# Patient Record
Sex: Female | Born: 1965 | Race: White | Hispanic: No | Marital: Married | Smoking: Never smoker
Health system: Southern US, Community
[De-identification: ages and names within clinical notes are randomized; demographics above are authoritative.]

## PROBLEM LIST (undated history)

## (undated) DIAGNOSIS — I10 Essential (primary) hypertension: Secondary | ICD-10-CM

## (undated) HISTORY — PX: CYST EXCISION: SHX5701

## (undated) HISTORY — PX: CHOLECYSTECTOMY: SHX55

---

## 2017-12-24 ENCOUNTER — Other Ambulatory Visit: Payer: Self-pay | Admitting: Obstetrics & Gynecology

## 2017-12-24 DIAGNOSIS — Z1231 Encounter for screening mammogram for malignant neoplasm of breast: Secondary | ICD-10-CM

## 2018-01-20 ENCOUNTER — Ambulatory Visit
Admission: RE | Admit: 2018-01-20 | Discharge: 2018-01-20 | Disposition: A | Discharge status: Home / Self Care | Payer: BLUE CROSS/BLUE SHIELD | Source: Ambulatory Visit | Attending: Obstetrics & Gynecology | Admitting: Obstetrics & Gynecology

## 2018-01-20 DIAGNOSIS — Z1231 Encounter for screening mammogram for malignant neoplasm of breast: Secondary | ICD-10-CM

## 2018-04-28 NOTE — H&P (Signed)
52yo G2P2 who presents for [facility], D&C, ablation due to AUB. She reports no acute changes or problems since her last visit. Denies intermenstrual bleeding. Denies vaginal discharge, itching or odor. In review she notes that her periods have become heavier over the past year. Menses last for 7 days, 2 days of heavy flow where she often has to use a tampon and a pad- that will usually last for 2-4hours- just depends. She has been on OCPs for years- has not had a change in the pill and reports good compliance. At this point she wishes to proceed with ablation to help improve her periods Work up completed: 8/30: TVUS: [place] completed today: 12cm uterus with 3 fibroids- 4.5 fundal, post 4.7cm and ant 2.2cm. Normal left ovary. Right ovary wnl EMB: fragments of polyp, bening endometrium, no hyperplasia or malignancy.   Current Medications  Taking   Hydrochlorothiazide-12.5 mg 12.5 mg Tablet one tablet Orally once a day   Yasmin 28(Drospirenone-Ethinyl Estradiol) 3-0.03 MG Tablet 1 tablet Orally Once a day   Medication List reviewed and reconciled with the patient    Past Medical History  Hypertension.           Surgical History  C section   gall bladder   Nodule removal    Family History  Father: deceased, diagnosed with Hypertension, CVA  Mother: deceased, Hypertension, CVA  Maternal aunt: alive, Breast cancer   Social History  General:  Alcohol: yes.  Children: 2.  Tobacco use  cigarettes: Never smoked Tobacco history last updated 01/28/2018 Marital Status: married.  no Recreational drug use.  OCCUPATION: home maker.    Gyn History  Sexual activity currently sexually active.  Periods : every month.  LMP 04-07-18 .  Last pap smear date 05/2018- outside facility- in records.  Last mammogram date 01/20/2018.  Denies H/O STD.    OB History  Number of pregnancies 2.  Pregnancy # 1 live birth, vaginal delivery.  Pregnancy # 2 C-section.    Allergies  Penicillin: rash    Hospitalization/Major Diagnostic Procedure  None this past yr 01/2018   Review of Systems  CONSTITUTIONAL:  no Chills. no Fever. no Night sweats.  HEENT:  Blurrred vision no. no Double vision.  CARDIOLOGY:  no Chest pain.  RESPIRATORY:  no Shortness of breath. no Cough.  UROLOGY:  no Urinary frequency. no Urinary incontinence. no Urinary urgency.  GASTROENTEROLOGY:  no Abdominal pain. no Appetite change. no Change in bowel movements.  FEMALE REPRODUCTIVE:  See HPI for details. no Breast lumps or discharge. no Breast pain.  NEUROLOGY:  no Dizziness. no Headache. no Loss of consciousness.  PSYCHOLOGY:  no Anxiety. no Depression.  SKIN:  no Rash. no Hives.  HEMATOLOGY/LYMPH:  no Anemia. no Fatigue. Using Blood Thinners no.     Vital Signs  Wt 194.5, Wt change 1 lb, Ht 62.5, BMI 35.00, BP sitting 122/88.   Examination  General Examination: CONSTITUTIONAL: well developed, well nourished.  SKIN: warm and dry, no rashes.  NECK: supple, normal appearance.  LUNGS: clear to auscultation bilaterally, no wheezes, rhonchi, rales.  HEART: no murmurs, regular rate and rhythm.  ABDOMEN: soft and not tender, no masses palpated, no rebound, no rigidity.  MUSCULOSKELETAL no calf tenderness bilaterally.  EXTREMITIES: no edema present.  NEUROLOGIC EXAM: alert and oriented x 3.  PSYCH: appropriate mood and affect.     A/P: 52yo G2P2 who presents for [facility], D&C, HTA ablation -NPO -LR @ 125cc/hr -SCDs to OR -Reviewed upcoming surgery- discussed risk/benefit and  alternatives to treatment. Reviewed risk including but not limited to risk of bleeding, infection, failure of device, uterine perforation. Questions and concerns were addressed and she desires to proceed  Myna Hidalgo, DO [phone] (cell) [phone] (office)

## 2018-05-09 ENCOUNTER — Encounter (HOSPITAL_COMMUNITY): Payer: Self-pay

## 2018-05-09 ENCOUNTER — Encounter (HOSPITAL_COMMUNITY)
Admission: RE | Admit: 2018-05-09 | Discharge: 2018-05-09 | Disposition: A | Discharge status: Home / Self Care | Payer: BLUE CROSS/BLUE SHIELD | Source: Ambulatory Visit | Attending: Obstetrics & Gynecology | Admitting: Obstetrics & Gynecology

## 2018-05-09 ENCOUNTER — Other Ambulatory Visit: Payer: Self-pay

## 2018-05-09 DIAGNOSIS — Z01812 Encounter for preprocedural laboratory examination: Secondary | ICD-10-CM | POA: Insufficient documentation

## 2018-05-09 HISTORY — DX: Essential (primary) hypertension: I10

## 2018-05-09 LAB — COMPREHENSIVE METABOLIC PANEL
ALK PHOS: 73 U/L (ref 38–126)
ALT: 14 U/L (ref 0–44)
AST: 18 U/L (ref 15–41)
Albumin: 3.3 g/dL — ABNORMAL LOW (ref 3.5–5.0)
Anion gap: 9 (ref 5–15)
BILIRUBIN TOTAL: 0.4 mg/dL (ref 0.3–1.2)
BUN: 13 mg/dL (ref 6–20)
CALCIUM: 8.6 mg/dL — AB (ref 8.9–10.3)
CHLORIDE: 102 mmol/L (ref 98–111)
CO2: 26 mmol/L (ref 22–32)
CREATININE: 0.91 mg/dL (ref 0.44–1.00)
Glucose, Bld: 88 mg/dL (ref 70–99)
Potassium: 3.6 mmol/L (ref 3.5–5.1)
Sodium: 137 mmol/L (ref 135–145)
TOTAL PROTEIN: 7.7 g/dL (ref 6.5–8.1)

## 2018-05-09 LAB — CBC
HEMATOCRIT: 28.3 % — AB (ref 36.0–46.0)
Hemoglobin: 8 g/dL — ABNORMAL LOW (ref 12.0–15.0)
MCH: 17.6 pg — AB (ref 26.0–34.0)
MCHC: 28.3 g/dL — ABNORMAL LOW (ref 30.0–36.0)
MCV: 62.3 fL — AB (ref 80.0–100.0)
PLATELETS: 547 10*3/uL — AB (ref 150–400)
RBC: 4.54 MIL/uL (ref 3.87–5.11)
RDW: 17.2 % — ABNORMAL HIGH (ref 11.5–15.5)
WBC: 10.1 10*3/uL (ref 4.0–10.5)
nRBC: 0 % (ref 0.0–0.2)

## 2018-05-09 NOTE — Patient Instructions (Addendum)
Your procedure is scheduled on:Wednesday 11/20 300 pm  Enter through the Main Entrance of [facility] at 130 pm  Pick up the phone at the desk and dial [phone].  Call this number if you have problems the morning of surgery: [phone].  Remember: Do NOT eat food: Do NOT drink clear liquids after: Take these medicines the morning of surgery with a SIP OF WATER:none  Do NOT wear jewelry (body piercing), metal hair clips/bobby pins, make-up, or nail polish. Do NOT wear lotions, powders, or perfumes.  You may wear deoderant. Do NOT shave for 48 hours prior to surgery. Do NOT bring valuables to the hospital. Contacts, dentures, or bridgework may not be worn into surgery.  Have a responsible adult drive you home and stay with you for 24 hours after your procedure

## 2018-05-10 NOTE — Pre-Procedure Instructions (Signed)
Dr. Richardson Landryhouser  Viewed EKG Elveria RisingAWARW OF HISTORY

## 2018-05-17 NOTE — Anesthesia Preprocedure Evaluation (Addendum)
Anesthesia Evaluation  Patient identified by MRN, date of birth, ID band Patient awake    Reviewed: Allergy & Precautions, NPO status , Patient's Chart, lab work & pertinent test results  History of Anesthesia Complications Negative for: history of anesthetic complications  Airway Mallampati: II  TM Distance: >3 FB Neck ROM: Full    Dental no notable dental hx. (+) Teeth Intact, Dental Advisory Given   Pulmonary neg pulmonary ROS,    Pulmonary exam normal breath sounds clear to auscultation       Cardiovascular hypertension, Pt. on medications Normal cardiovascular exam Rhythm:Regular Rate:Normal     Neuro/Psych negative neurological ROS     GI/Hepatic negative GI ROS, Neg liver ROS,   Endo/Other  negative endocrine ROS  Renal/GU negative Renal ROS     Musculoskeletal negative musculoskeletal ROS (+)   Abdominal   Peds  Hematology negative hematology ROS (+) anemia , Hgb 8.0 on 05/09/18   Anesthesia Other Findings Day of surgery medications reviewed with the patient.  Reproductive/Obstetrics                            Anesthesia Physical Anesthesia Plan  ASA: II  Anesthesia Plan: General   Post-op Pain Management:    Induction: Intravenous  PONV Risk Score and Plan: 3 and Treatment may vary due to age or medical condition, Ondansetron, Dexamethasone and Midazolam  Airway Management Planned: LMA  Additional Equipment:   Intra-op Plan:   Post-operative Plan: Extubation in OR  Informed Consent: I have reviewed the patients History and Physical, chart, labs and discussed the procedure including the risks, benefits and alternatives for the proposed anesthesia with the patient or authorized representative who has indicated his/her understanding and acceptance.   Dental advisory given  Plan Discussed with: CRNA  Anesthesia Plan Comments:        Anesthesia Quick  Evaluation

## 2018-05-18 ENCOUNTER — Ambulatory Visit (HOSPITAL_COMMUNITY)
Admission: RE | Admit: 2018-05-18 | Discharge: 2018-05-18 | Disposition: A | Discharge status: Home / Self Care | Payer: BLUE CROSS/BLUE SHIELD | Source: Ambulatory Visit | Attending: Obstetrics & Gynecology | Admitting: Obstetrics & Gynecology

## 2018-05-18 ENCOUNTER — Ambulatory Visit (HOSPITAL_COMMUNITY): Payer: BLUE CROSS/BLUE SHIELD | Admitting: Anesthesiology

## 2018-05-18 ENCOUNTER — Encounter (HOSPITAL_COMMUNITY): Payer: Self-pay | Admitting: Anesthesiology

## 2018-05-18 ENCOUNTER — Other Ambulatory Visit: Payer: Self-pay

## 2018-05-18 ENCOUNTER — Encounter (HOSPITAL_COMMUNITY)
Admission: RE | Disposition: A | Discharge status: Home / Self Care | Payer: Self-pay | Source: Ambulatory Visit | Attending: Obstetrics & Gynecology

## 2018-05-18 DIAGNOSIS — I10 Essential (primary) hypertension: Secondary | ICD-10-CM | POA: Insufficient documentation

## 2018-05-18 DIAGNOSIS — N939 Abnormal uterine and vaginal bleeding, unspecified: Secondary | ICD-10-CM | POA: Insufficient documentation

## 2018-05-18 DIAGNOSIS — N840 Polyp of corpus uteri: Secondary | ICD-10-CM | POA: Insufficient documentation

## 2018-05-18 DIAGNOSIS — Z79899 Other long term (current) drug therapy: Secondary | ICD-10-CM | POA: Diagnosis not present

## 2018-05-18 HISTORY — PX: DILITATION & CURRETTAGE/HYSTROSCOPY WITH HYDROTHERMAL ABLATION: SHX5570

## 2018-05-18 LAB — TYPE AND SCREEN
ABO/RH(D): O POS
ANTIBODY SCREEN: NEGATIVE

## 2018-05-18 LAB — ABO/RH: ABO/RH(D): O POS

## 2018-05-18 SURGERY — DILATATION & CURETTAGE/HYSTEROSCOPY WITH HYDROTHERMAL ABLATION
Anesthesia: General | Site: Vagina

## 2018-05-18 MED ORDER — SODIUM CHLORIDE 0.9 % IR SOLN
Status: DC | PRN
  Administered 2018-05-18: 3000 mL

## 2018-05-18 MED ORDER — LACTATED RINGERS IV SOLN
INTRAVENOUS | Status: DC
  Administered 2018-05-18 (×2): via INTRAVENOUS

## 2018-05-18 MED ORDER — LIDOCAINE-EPINEPHRINE (PF) 1 %-1:200000 IJ SOLN
INTRAMUSCULAR | Status: AC
  Filled 2018-05-18: qty 30

## 2018-05-18 MED ORDER — GLYCOPYRROLATE 0.2 MG/ML IJ SOLN
INTRAMUSCULAR | Status: AC
  Filled 2018-05-18: qty 1

## 2018-05-18 MED ORDER — FENTANYL CITRATE (PF) 100 MCG/2ML IJ SOLN: 25.0000 ug | INTRAMUSCULAR | Status: DC | PRN

## 2018-05-18 MED ORDER — ONDANSETRON HCL 4 MG/2ML IJ SOLN
INTRAMUSCULAR | Status: AC
  Filled 2018-05-18: qty 2

## 2018-05-18 MED ORDER — MIDAZOLAM HCL 2 MG/2ML IJ SOLN
INTRAMUSCULAR | Status: DC | PRN
  Administered 2018-05-18: 2 mg via INTRAVENOUS

## 2018-05-18 MED ORDER — PROPOFOL 10 MG/ML IV BOLUS
INTRAVENOUS | Status: DC | PRN
  Administered 2018-05-18: 60 mg via INTRAVENOUS
  Administered 2018-05-18: 170 mg via INTRAVENOUS
  Administered 2018-05-18 (×2): 30 mg via INTRAVENOUS
  Administered 2018-05-18: 40 mg via INTRAVENOUS

## 2018-05-18 MED ORDER — DEXAMETHASONE SODIUM PHOSPHATE 4 MG/ML IJ SOLN
INTRAMUSCULAR | Status: DC | PRN
  Administered 2018-05-18: 4 mg via INTRAVENOUS

## 2018-05-18 MED ORDER — PROMETHAZINE HCL 25 MG/ML IJ SOLN: 6.2500 mg | INTRAMUSCULAR | Status: DC | PRN

## 2018-05-18 MED ORDER — MIDAZOLAM HCL 2 MG/2ML IJ SOLN
INTRAMUSCULAR | Status: AC
  Filled 2018-05-18: qty 2

## 2018-05-18 MED ORDER — SCOPOLAMINE 1 MG/3DAYS TD PT72
MEDICATED_PATCH | TRANSDERMAL | Status: AC
  Administered 2018-05-18: 1.5 mg via TRANSDERMAL
  Filled 2018-05-18: qty 1

## 2018-05-18 MED ORDER — LIDOCAINE HCL (CARDIAC) PF 100 MG/5ML IV SOSY
PREFILLED_SYRINGE | INTRAVENOUS | Status: AC
  Filled 2018-05-18: qty 5

## 2018-05-18 MED ORDER — LIDOCAINE-EPINEPHRINE 1 %-1:100000 IJ SOLN
INTRAMUSCULAR | Status: AC
  Filled 2018-05-18: qty 1

## 2018-05-18 MED ORDER — LIDOCAINE-EPINEPHRINE (PF) 1 %-1:200000 IJ SOLN
INTRAMUSCULAR | Status: DC | PRN
  Administered 2018-05-18: 20 mL

## 2018-05-18 MED ORDER — LIDOCAINE HCL (CARDIAC) PF 100 MG/5ML IV SOSY
PREFILLED_SYRINGE | INTRAVENOUS | Status: DC | PRN
  Administered 2018-05-18 (×2): 40 mg via INTRAVENOUS

## 2018-05-18 MED ORDER — FENTANYL CITRATE (PF) 250 MCG/5ML IJ SOLN
INTRAMUSCULAR | Status: DC | PRN
  Administered 2018-05-18 (×3): 50 ug via INTRAVENOUS

## 2018-05-18 MED ORDER — OXYCODONE HCL 5 MG PO TABS: 5.0000 mg | ORAL_TABLET | Freq: Once | ORAL | Status: DC | PRN

## 2018-05-18 MED ORDER — LACTATED RINGERS IV SOLN: INTRAVENOUS | Status: DC

## 2018-05-18 MED ORDER — ACETAMINOPHEN 10 MG/ML IV SOLN: 1000.0000 mg | Freq: Once | INTRAVENOUS | Status: DC | PRN

## 2018-05-18 MED ORDER — SCOPOLAMINE 1 MG/3DAYS TD PT72
MEDICATED_PATCH | TRANSDERMAL | Status: AC
  Filled 2018-05-18: qty 1

## 2018-05-18 MED ORDER — SUCCINYLCHOLINE CHLORIDE 200 MG/10ML IV SOSY
PREFILLED_SYRINGE | INTRAVENOUS | Status: AC
  Filled 2018-05-18: qty 10

## 2018-05-18 MED ORDER — FENTANYL CITRATE (PF) 100 MCG/2ML IJ SOLN
INTRAMUSCULAR | Status: AC
  Filled 2018-05-18: qty 4

## 2018-05-18 MED ORDER — ONDANSETRON HCL 4 MG/2ML IJ SOLN
INTRAMUSCULAR | Status: DC | PRN
  Administered 2018-05-18: 4 mg via INTRAVENOUS

## 2018-05-18 MED ORDER — KETOROLAC TROMETHAMINE 30 MG/ML IJ SOLN
INTRAMUSCULAR | Status: DC | PRN
  Administered 2018-05-18: 30 mg via INTRAVENOUS

## 2018-05-18 MED ORDER — OXYCODONE HCL 5 MG/5ML PO SOLN: 5.0000 mg | Freq: Once | ORAL | Status: DC | PRN

## 2018-05-18 MED ORDER — SCOPOLAMINE 1 MG/3DAYS TD PT72
1.0000 | MEDICATED_PATCH | Freq: Once | TRANSDERMAL | Status: DC
  Administered 2018-05-18: 1.5 mg via TRANSDERMAL

## 2018-05-18 MED ORDER — PROPOFOL 10 MG/ML IV BOLUS
INTRAVENOUS | Status: AC
  Filled 2018-05-18: qty 40

## 2018-05-18 SURGICAL SUPPLY — 11 items
CANISTER SUCT 3000ML PPV (MISCELLANEOUS) ×3 IMPLANT
CATH ROBINSON RED A/P 16FR (CATHETERS) ×3 IMPLANT
DILATOR CANAL MILEX (MISCELLANEOUS) ×3 IMPLANT
GLOVE BIOGEL PI IND STRL 7.0 (GLOVE) ×2 IMPLANT
GLOVE BIOGEL PI INDICATOR 7.0 (GLOVE) ×4
GLOVE ECLIPSE 6.5 STRL STRAW (GLOVE) ×3 IMPLANT
GOWN STRL REUS W/TWL LRG LVL3 (GOWN DISPOSABLE) ×6 IMPLANT
PACK VAGINAL MINOR WOMEN LF (CUSTOM PROCEDURE TRAY) ×3 IMPLANT
PAD OB MATERNITY 4.3X12.25 (PERSONAL CARE ITEMS) ×3 IMPLANT
SET GENESYS HTA PROCERVA (MISCELLANEOUS) ×3 IMPLANT
TOWEL OR 17X24 6PK STRL BLUE (TOWEL DISPOSABLE) ×6 IMPLANT

## 2018-05-18 NOTE — Interval H&P Note (Signed)
History and Physical Interval Note:  05/18/2018 3:34 PM  Amber Rios  has presented today for surgery, with the diagnosis of N93.9 Abnormal uterine bleeding N92.0 Menorrhagia w/ regular cycle  The various methods of treatment have been discussed with the patient and family. After consideration of risks, benefits and other options for treatment, the patient has consented to  Procedure(s): DILATATION & CURETTAGE/HYSTEROSCOPY WITH HYDROTHERMAL ABLATION (N/A) as a surgical intervention .  The patient's history has been reviewed, patient examined, no change in status, stable for surgery.  I have reviewed the patient's chart and labs.  Questions were answered to the patient's satisfaction.     Sharon SellerJennifer M Cybele Maule

## 2018-05-18 NOTE — Op Note (Signed)
Operative Report  PreOp: Abnormal uterine bleeding PostOp: same Procedure:  Hysteroscopy, Dilation and Curettage, failed ablation Surgeon: Dr. Myna HidalgoJennifer Kailena Lubas Anesthesia: General Complications: unable to complete ablation EBL: Minimal UOP: 30cc Deficit: 250cc  Findings:12cm anteverted uterus, proliferative endometrium with dense tissue  Specimens: endometrial curettings  Procedure: The patient was taken to the operating room where she underwent general anesthesia without difficulty. The patient was placed in a low lithotomy position using Allen stirrups. The patient was examined with the findings as noted above.  She was then prepped and draped in the normal sterile fashion. The bladder was drained using a red rubber urethral catheter. A sterile speculum was inserted into the vagina. A single tooth tenaculum was placed on the anterior lip of the cervix. The uterus was then sounded to 12. The endocervical canal was then serially dilated using Hank dilators.  Sharp curettage was performed. The tissue was sent to pathology.  The HTA system was inserted- the back wall of the uterus was seen- proliferative tissue noted.  Device was activated; however, failed due to inadequate seal.  Several measures were used to try and re-assess; however device still failed.  Hysteroscope was inserted- no uterine perforation was appreciated. All instrument were removed. Hemostasis was observed at the cervical site using silver nitrate. The patient was repositioned to the supine position. The patient tolerated the procedure without any complications and taken to recovery in stable condition.   Myna HidalgoJennifer Holle Sprick, DO [phone] (pager) [phone] (office)

## 2018-05-18 NOTE — Anesthesia Procedure Notes (Signed)
Procedure Name: LMA Insertion Date/Time: 05/18/2018 4:14 PM Performed by: Elgie CongoMalinova, Davinia Riccardi H, CRNA Pre-anesthesia Checklist: Patient identified, Emergency Drugs available, Suction available and Patient being monitored Patient Re-evaluated:Patient Re-evaluated prior to induction Oxygen Delivery Method: Circle system utilized Preoxygenation: Pre-oxygenation with 100% oxygen Induction Type: IV induction Ventilation: Mask ventilation without difficulty and Oral airway inserted - appropriate to patient size LMA: LMA inserted LMA Size: 4.0 Tube type: Oral Number of attempts: 2 Placement Confirmation: positive ETCO2 and breath sounds checked- equal and bilateral Tube secured with: Tape Dental Injury: Teeth and Oropharynx as per pre-operative assessment

## 2018-05-18 NOTE — Discharge Instructions (Addendum)
°  Post Anesthesia Home Care Instructions  Activity: Get plenty of rest for the remainder of the day. A responsible individual must stay with you for 24 hours following the procedure.  For the next 24 hours, DO NOT: -Drive a car -Advertising copywriterperate machinery -Drink alcoholic beverages -Take any medication unless instructed by your physician -Make any legal decisions or sign important papers.  Meals: Start with liquid foods such as gelatin or soup. Progress to regular foods as tolerated. Avoid greasy, spicy, heavy foods. If nausea and/or vomiting occur, drink only clear liquids until the nausea and/or vomiting subsides. Call your physician if vomiting continues.  Special Instructions/Symptoms: Your throat may feel dry or sore from the anesthesia or the breathing tube placed in your throat during surgery. If this causes discomfort, gargle with warm salt water. The discomfort should disappear within 24 hours.  If you had a scopolamine patch placed behind your ear for the management of post- operative nausea and/or vomiting:  1. The medication in the patch is effective for 72 hours, after which it should be removed.  Wrap patch in a tissue and discard in the trash. Wash hands thoroughly with soap and water. 2. You may remove the patch earlier than 72 hours if you experience unpleasant side effects which may include dry mouth, dizziness or visual disturbances. 3. Avoid touching the patch. Wash your hands with soap and water after contact with the patch.   HOME INSTRUCTIONS  Please note any unusual or excessive bleeding, pain, swelling. Mild dizziness or drowsiness are normal for about 24 hours after surgery.   Shower when comfortable  Restrictions: No driving for 24 hours or while taking pain medications.  Activity:  No heavy lifting (> 10 lbs), nothing in vagina (no tampons, douching, or intercourse) x 2 weeks; no tub baths for 2 weeks Vaginal spotting is expected but if your bleeding is heavy,  period like,  please call the office   Incision: the bandaids will fall off when they are ready to; you may clean your incision with mild soap and water but do not rub or scrub the incision site.  You may experience slight bloody drainage from your incision periodically.  This is normal.  If you experience a large amount of drainage or the incision opens, please call your physician who will likely direct you to the emergency department.  Diet:  You may return to your regular diet.  Do not eat large meals.  Eat small frequent meals throughout the day.  Continue to drink a good amount of water at least 6-8 glasses of water per day, hydration is very important for the healing process.  Pain Management: Take over the counter Ibuprofen (Motrin) and/or tylenol as prescribed/needed for pain.  Always take prescription pain medication with food, it may cause constipation, increase fluids and fiber and you may want to take an over-the-counter stool softener like Colace as needed up to 2x a day.    Alcohol -- Avoid for 24 hours and while taking pain medications.  Nausea: Take sips of ginger ale or soda  Fever -- Call physician if temperature over 101 degrees  Follow up:  If you do not already have a follow up appointment scheduled, please call the office at [phone].  If you experience fever (a temperature greater than 100.4), pain unrelieved by pain medication, shortness of breath, swelling of a single leg, or any other symptoms which are concerning to you please the office immediately.

## 2018-05-18 NOTE — Transfer of Care (Signed)
Immediate Anesthesia Transfer of Care Note  Patient: Amber Rios  Procedure(s) Performed: DILATATION & CURETTAGE /DIAGNOSTIC HYSTEROSCOPY, FAILED ABLATION (N/A Vagina )  Patient Location: PACU  Anesthesia Type:General  Level of Consciousness: awake, alert  and oriented  Airway & Oxygen Therapy: Patient Spontanous Breathing and Patient connected to nasal cannula oxygen  Post-op Assessment: Report given to RN, Post -op Vital signs reviewed and stable and Patient moving all extremities  Post vital signs: Reviewed and stable  Last Vitals:  Vitals Value Taken Time  BP 134/74 05/18/2018  5:07 PM  Temp    Pulse 92 05/18/2018  5:09 PM  Resp 11 05/18/2018  5:09 PM  SpO2 100 % 05/18/2018  5:09 PM  Vitals shown include unvalidated device data.  Last Pain:  Vitals:   05/18/18 1346  TempSrc: Oral  PainSc: 0-No pain      Patients Stated Pain Goal: 3 (05/18/18 1346)  Complications: No apparent anesthesia complications

## 2018-05-18 NOTE — Anesthesia Postprocedure Evaluation (Signed)
Anesthesia Post Note  Patient: Leeanne Rioeresa N Brafford  Procedure(s) Performed: DILATATION & CURETTAGE /DIAGNOSTIC HYSTEROSCOPY, FAILED ABLATION (N/A Vagina )     Patient location during evaluation: PACU Anesthesia Type: General Level of consciousness: awake and alert Pain management: pain level controlled Vital Signs Assessment: post-procedure vital signs reviewed and stable Respiratory status: spontaneous breathing, nonlabored ventilation and respiratory function stable Cardiovascular status: blood pressure returned to baseline and stable Postop Assessment: no apparent nausea or vomiting Anesthetic complications: no    Last Vitals:  Vitals:   05/18/18 1346  BP: (!) 152/81  Pulse: (!) 101  Resp: 16  Temp: 37.2 C  SpO2: 100%    Last Pain:  Vitals:   05/18/18 1346  TempSrc: Oral  PainSc: 0-No pain   Pain Goal: Patients Stated Pain Goal: 3 (05/18/18 1346)               Kaylyn LayerKathryn E Jakeel Starliper

## 2018-05-19 ENCOUNTER — Encounter (HOSPITAL_COMMUNITY): Payer: Self-pay | Admitting: Obstetrics & Gynecology

## 2019-05-19 DIAGNOSIS — Z01419 Encounter for gynecological examination (general) (routine) without abnormal findings: Secondary | ICD-10-CM | POA: Diagnosis not present

## 2019-05-23 DIAGNOSIS — Z6834 Body mass index (BMI) 34.0-34.9, adult: Secondary | ICD-10-CM | POA: Diagnosis not present

## 2019-05-23 DIAGNOSIS — Z20828 Contact with and (suspected) exposure to other viral communicable diseases: Secondary | ICD-10-CM | POA: Diagnosis not present

## 2019-06-25 DIAGNOSIS — Z23 Encounter for immunization: Secondary | ICD-10-CM | POA: Diagnosis not present

## 2019-07-24 DIAGNOSIS — I10 Essential (primary) hypertension: Secondary | ICD-10-CM | POA: Diagnosis not present

## 2019-07-24 DIAGNOSIS — N921 Excessive and frequent menstruation with irregular cycle: Secondary | ICD-10-CM | POA: Diagnosis not present

## 2019-07-24 DIAGNOSIS — Z13220 Encounter for screening for lipoid disorders: Secondary | ICD-10-CM | POA: Diagnosis not present

## 2019-07-24 DIAGNOSIS — Z0000 Encounter for general adult medical examination without abnormal findings: Secondary | ICD-10-CM | POA: Diagnosis not present

## 2019-07-24 DIAGNOSIS — N951 Menopausal and female climacteric states: Secondary | ICD-10-CM | POA: Diagnosis not present

## 2019-08-26 ENCOUNTER — Ambulatory Visit: Payer: BC Managed Care – PPO | Attending: Internal Medicine

## 2019-08-26 DIAGNOSIS — Z23 Encounter for immunization: Secondary | ICD-10-CM | POA: Insufficient documentation

## 2019-08-26 NOTE — Progress Notes (Signed)
   Covid-19 Vaccination Clinic  Name:  CARLETHA DAWN    MRN: [id number] DOB: August 14, 1965  08/26/2019  Ms. Burr was observed post Covid-19 immunization for 30 minutes based on pre-vaccination screening without incidence. She was provided with Vaccine Information Sheet and instruction to access the V-Safe system.   Ms. Stemmler was instructed to call 911 with any severe reactions post vaccine: Marland Kitchen Difficulty breathing  . Swelling of your face and throat  . A fast heartbeat  . A bad rash all over your body  . Dizziness and weakness    Immunizations Administered    Name Date Dose VIS Date Route   Pfizer COVID-19 Vaccine 08/26/2019  2:07 PM 0.3 mL 06/09/2019 Intramuscular   Manufacturer: [employer], [employer]   Lot: [id number]   NDC: [address]-1

## 2019-09-16 ENCOUNTER — Ambulatory Visit: Payer: BC Managed Care – PPO | Attending: Internal Medicine

## 2019-09-16 DIAGNOSIS — Z23 Encounter for immunization: Secondary | ICD-10-CM

## 2019-09-16 NOTE — Progress Notes (Signed)
   Covid-19 Vaccination Clinic  Name:  Amber Rios    MRN: [id number] DOB: October 18, 1965  09/16/2019  Amber Rios was observed post Covid-19 immunization for 15 minutes without incident. She was provided with Vaccine Information Sheet and instruction to access the V-Safe system.   Amber Rios was instructed to call 911 with any severe reactions post vaccine: Marland Kitchen Difficulty breathing  . Swelling of face and throat  . A fast heartbeat  . A bad rash all over body  . Dizziness and weakness   Immunizations Administered    Name Date Dose VIS Date Route   Pfizer COVID-19 Vaccine 09/16/2019 11:34 AM 0.3 mL 06/09/2019 Intramuscular   Manufacturer: [employer], [employer]   Lot: [id number]   NDC: [address]-1

## 2020-02-05 DIAGNOSIS — Z01818 Encounter for other preprocedural examination: Secondary | ICD-10-CM | POA: Diagnosis not present

## 2020-03-28 DIAGNOSIS — Z1159 Encounter for screening for other viral diseases: Secondary | ICD-10-CM | POA: Diagnosis not present

## 2020-04-02 DIAGNOSIS — K648 Other hemorrhoids: Secondary | ICD-10-CM | POA: Diagnosis not present

## 2020-04-02 DIAGNOSIS — K644 Residual hemorrhoidal skin tags: Secondary | ICD-10-CM | POA: Diagnosis not present

## 2020-04-02 DIAGNOSIS — Z1211 Encounter for screening for malignant neoplasm of colon: Secondary | ICD-10-CM | POA: Diagnosis not present

## 2020-04-02 DIAGNOSIS — K635 Polyp of colon: Secondary | ICD-10-CM | POA: Diagnosis not present

## 2020-04-02 DIAGNOSIS — D123 Benign neoplasm of transverse colon: Secondary | ICD-10-CM | POA: Diagnosis not present

## 2020-05-28 ENCOUNTER — Other Ambulatory Visit: Payer: Self-pay | Admitting: Obstetrics and Gynecology

## 2020-05-28 DIAGNOSIS — Z1231 Encounter for screening mammogram for malignant neoplasm of breast: Secondary | ICD-10-CM

## 2020-05-28 DIAGNOSIS — Z01419 Encounter for gynecological examination (general) (routine) without abnormal findings: Secondary | ICD-10-CM | POA: Diagnosis not present

## 2020-07-09 ENCOUNTER — Ambulatory Visit
Admission: RE | Admit: 2020-07-09 | Discharge: 2020-07-09 | Disposition: A | Discharge status: Home / Self Care | Payer: BC Managed Care – PPO | Source: Ambulatory Visit | Attending: Obstetrics and Gynecology | Admitting: Obstetrics and Gynecology

## 2020-07-09 ENCOUNTER — Other Ambulatory Visit: Payer: Self-pay

## 2020-07-09 DIAGNOSIS — Z1231 Encounter for screening mammogram for malignant neoplasm of breast: Secondary | ICD-10-CM

## 2020-08-27 DIAGNOSIS — D500 Iron deficiency anemia secondary to blood loss (chronic): Secondary | ICD-10-CM | POA: Diagnosis not present

## 2020-08-27 DIAGNOSIS — Z0000 Encounter for general adult medical examination without abnormal findings: Secondary | ICD-10-CM | POA: Diagnosis not present

## 2020-08-27 DIAGNOSIS — I10 Essential (primary) hypertension: Secondary | ICD-10-CM | POA: Diagnosis not present

## 2020-08-27 DIAGNOSIS — Z13220 Encounter for screening for lipoid disorders: Secondary | ICD-10-CM | POA: Diagnosis not present

## 2021-06-05 DIAGNOSIS — Z124 Encounter for screening for malignant neoplasm of cervix: Secondary | ICD-10-CM | POA: Diagnosis not present

## 2021-06-05 DIAGNOSIS — Z01419 Encounter for gynecological examination (general) (routine) without abnormal findings: Secondary | ICD-10-CM | POA: Diagnosis not present

## 2021-06-06 ENCOUNTER — Other Ambulatory Visit: Payer: Self-pay | Admitting: Obstetrics and Gynecology

## 2021-06-06 DIAGNOSIS — Z1231 Encounter for screening mammogram for malignant neoplasm of breast: Secondary | ICD-10-CM

## 2021-07-15 ENCOUNTER — Ambulatory Visit
Admission: RE | Admit: 2021-07-15 | Discharge: 2021-07-15 | Disposition: A | Discharge status: Home / Self Care | Payer: BC Managed Care – PPO | Source: Ambulatory Visit | Attending: Obstetrics and Gynecology | Admitting: Obstetrics and Gynecology

## 2021-07-15 DIAGNOSIS — Z1231 Encounter for screening mammogram for malignant neoplasm of breast: Secondary | ICD-10-CM | POA: Diagnosis not present

## 2021-07-16 ENCOUNTER — Other Ambulatory Visit: Payer: Self-pay | Admitting: Obstetrics and Gynecology

## 2021-07-16 DIAGNOSIS — R928 Other abnormal and inconclusive findings on diagnostic imaging of breast: Secondary | ICD-10-CM

## 2021-08-11 ENCOUNTER — Ambulatory Visit
Admission: RE | Admit: 2021-08-11 | Discharge: 2021-08-11 | Disposition: A | Discharge status: Home / Self Care | Payer: BC Managed Care – PPO | Source: Ambulatory Visit | Attending: Obstetrics and Gynecology | Admitting: Obstetrics and Gynecology

## 2021-08-11 ENCOUNTER — Ambulatory Visit: Payer: BC Managed Care – PPO

## 2021-08-11 DIAGNOSIS — R928 Other abnormal and inconclusive findings on diagnostic imaging of breast: Secondary | ICD-10-CM

## 2021-08-11 DIAGNOSIS — R922 Inconclusive mammogram: Secondary | ICD-10-CM | POA: Diagnosis not present

## 2021-08-18 ENCOUNTER — Other Ambulatory Visit: Payer: BC Managed Care – PPO

## 2021-09-08 DIAGNOSIS — Z0000 Encounter for general adult medical examination without abnormal findings: Secondary | ICD-10-CM | POA: Diagnosis not present

## 2021-09-08 DIAGNOSIS — D500 Iron deficiency anemia secondary to blood loss (chronic): Secondary | ICD-10-CM | POA: Diagnosis not present

## 2021-09-08 DIAGNOSIS — Z13220 Encounter for screening for lipoid disorders: Secondary | ICD-10-CM | POA: Diagnosis not present

## 2021-09-08 DIAGNOSIS — Z131 Encounter for screening for diabetes mellitus: Secondary | ICD-10-CM | POA: Diagnosis not present

## 2021-12-26 DIAGNOSIS — I10 Essential (primary) hypertension: Secondary | ICD-10-CM | POA: Diagnosis not present

## 2021-12-26 DIAGNOSIS — R635 Abnormal weight gain: Secondary | ICD-10-CM | POA: Diagnosis not present

## 2021-12-26 DIAGNOSIS — N951 Menopausal and female climacteric states: Secondary | ICD-10-CM | POA: Diagnosis not present

## 2022-01-06 DIAGNOSIS — Z6834 Body mass index (BMI) 34.0-34.9, adult: Secondary | ICD-10-CM | POA: Diagnosis not present

## 2022-01-06 DIAGNOSIS — Z1339 Encounter for screening examination for other mental health and behavioral disorders: Secondary | ICD-10-CM | POA: Diagnosis not present

## 2022-01-06 DIAGNOSIS — I10 Essential (primary) hypertension: Secondary | ICD-10-CM | POA: Diagnosis not present

## 2022-01-06 DIAGNOSIS — R635 Abnormal weight gain: Secondary | ICD-10-CM | POA: Diagnosis not present

## 2022-01-06 DIAGNOSIS — N951 Menopausal and female climacteric states: Secondary | ICD-10-CM | POA: Diagnosis not present

## 2022-01-06 DIAGNOSIS — Z1331 Encounter for screening for depression: Secondary | ICD-10-CM | POA: Diagnosis not present

## 2022-01-30 DIAGNOSIS — I10 Essential (primary) hypertension: Secondary | ICD-10-CM | POA: Diagnosis not present

## 2022-01-30 DIAGNOSIS — Z6834 Body mass index (BMI) 34.0-34.9, adult: Secondary | ICD-10-CM | POA: Diagnosis not present

## 2022-05-12 DIAGNOSIS — J019 Acute sinusitis, unspecified: Secondary | ICD-10-CM | POA: Diagnosis not present

## 2022-05-12 DIAGNOSIS — B9689 Other specified bacterial agents as the cause of diseases classified elsewhere: Secondary | ICD-10-CM | POA: Diagnosis not present

## 2022-06-17 DIAGNOSIS — I10 Essential (primary) hypertension: Secondary | ICD-10-CM | POA: Diagnosis not present

## 2022-07-17 ENCOUNTER — Other Ambulatory Visit: Payer: Self-pay | Admitting: Obstetrics and Gynecology

## 2022-07-17 DIAGNOSIS — Z1231 Encounter for screening mammogram for malignant neoplasm of breast: Secondary | ICD-10-CM

## 2022-08-31 ENCOUNTER — Ambulatory Visit
Admission: RE | Admit: 2022-08-31 | Discharge: 2022-08-31 | Disposition: A | Discharge status: Home / Self Care | Payer: BC Managed Care – PPO | Source: Ambulatory Visit | Attending: Obstetrics and Gynecology | Admitting: Obstetrics and Gynecology

## 2022-08-31 DIAGNOSIS — Z1231 Encounter for screening mammogram for malignant neoplasm of breast: Secondary | ICD-10-CM

## 2023-08-10 ENCOUNTER — Other Ambulatory Visit: Payer: Self-pay | Admitting: Obstetrics and Gynecology

## 2023-08-10 DIAGNOSIS — Z1231 Encounter for screening mammogram for malignant neoplasm of breast: Secondary | ICD-10-CM

## 2023-09-01 ENCOUNTER — Ambulatory Visit
Admission: RE | Admit: 2023-09-01 | Discharge: 2023-09-01 | Disposition: A | Discharge status: Home / Self Care | Payer: Self-pay | Source: Ambulatory Visit | Attending: Obstetrics and Gynecology | Admitting: Obstetrics and Gynecology

## 2023-09-01 DIAGNOSIS — Z1231 Encounter for screening mammogram for malignant neoplasm of breast: Secondary | ICD-10-CM

## 2024-01-22 IMAGING — MG MM DIGITAL DIAGNOSTIC UNILAT*R* W/ TOMO W/ CAD
4 series · 4 of 12 positions shown · non-contrast
Comparison: Previous exam(s).

CLINICAL DATA: Patient returns after screening study for evaluation
of possible RIGHT breast asymmetry.

EXAM:
DIGITAL DIAGNOSTIC UNILATERAL RIGHT MAMMOGRAM WITH TOMOSYNTHESIS AND
CAD
TECHNIQUE: Right digital diagnostic mammography and breast tomosynthesis was
performed. The images were evaluated with computer-aided detection.

[R ML synth-2D]
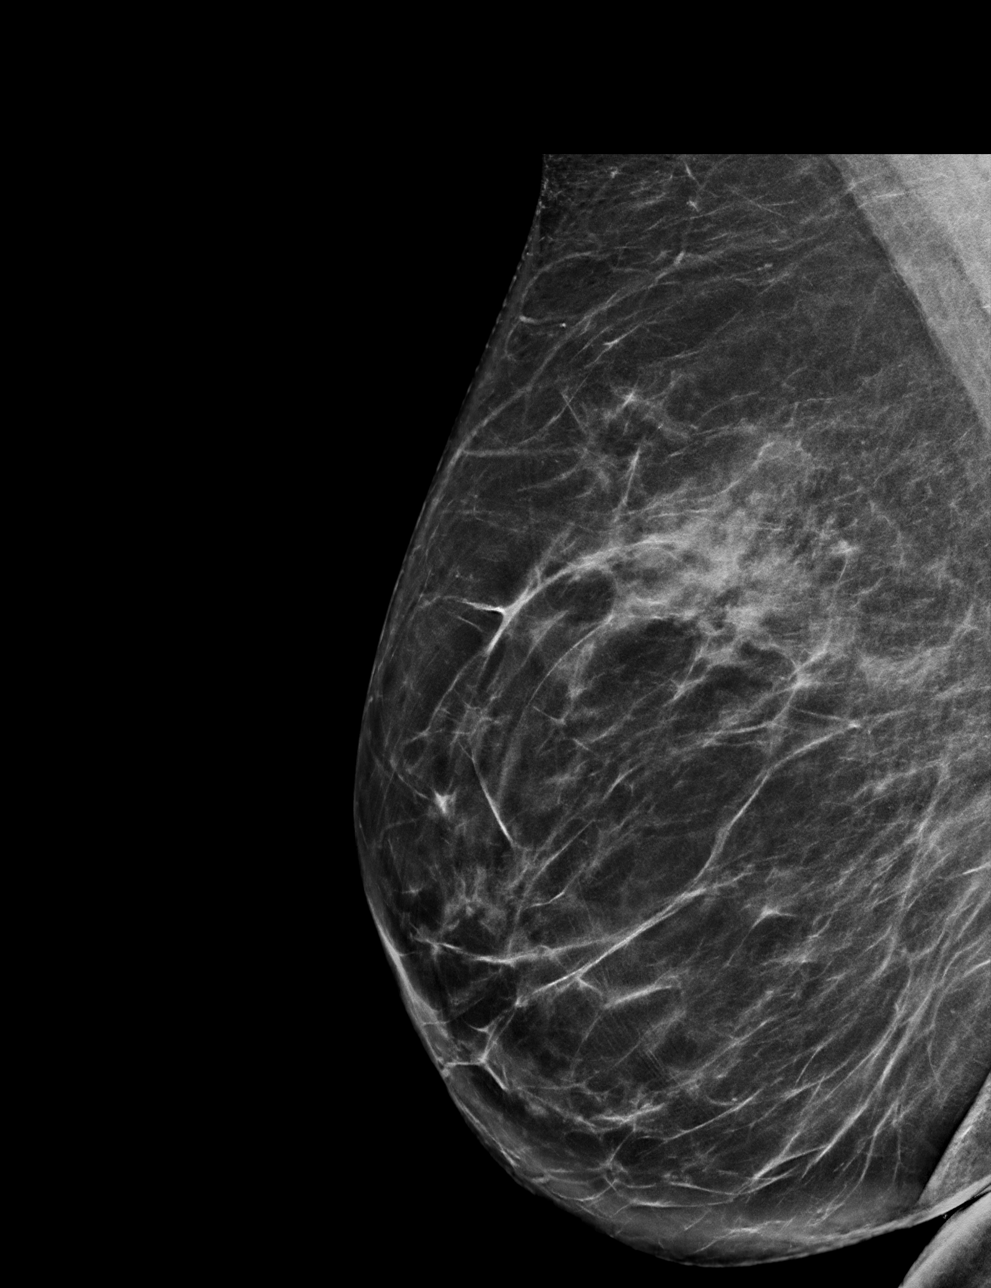

[R CC synth-2D]
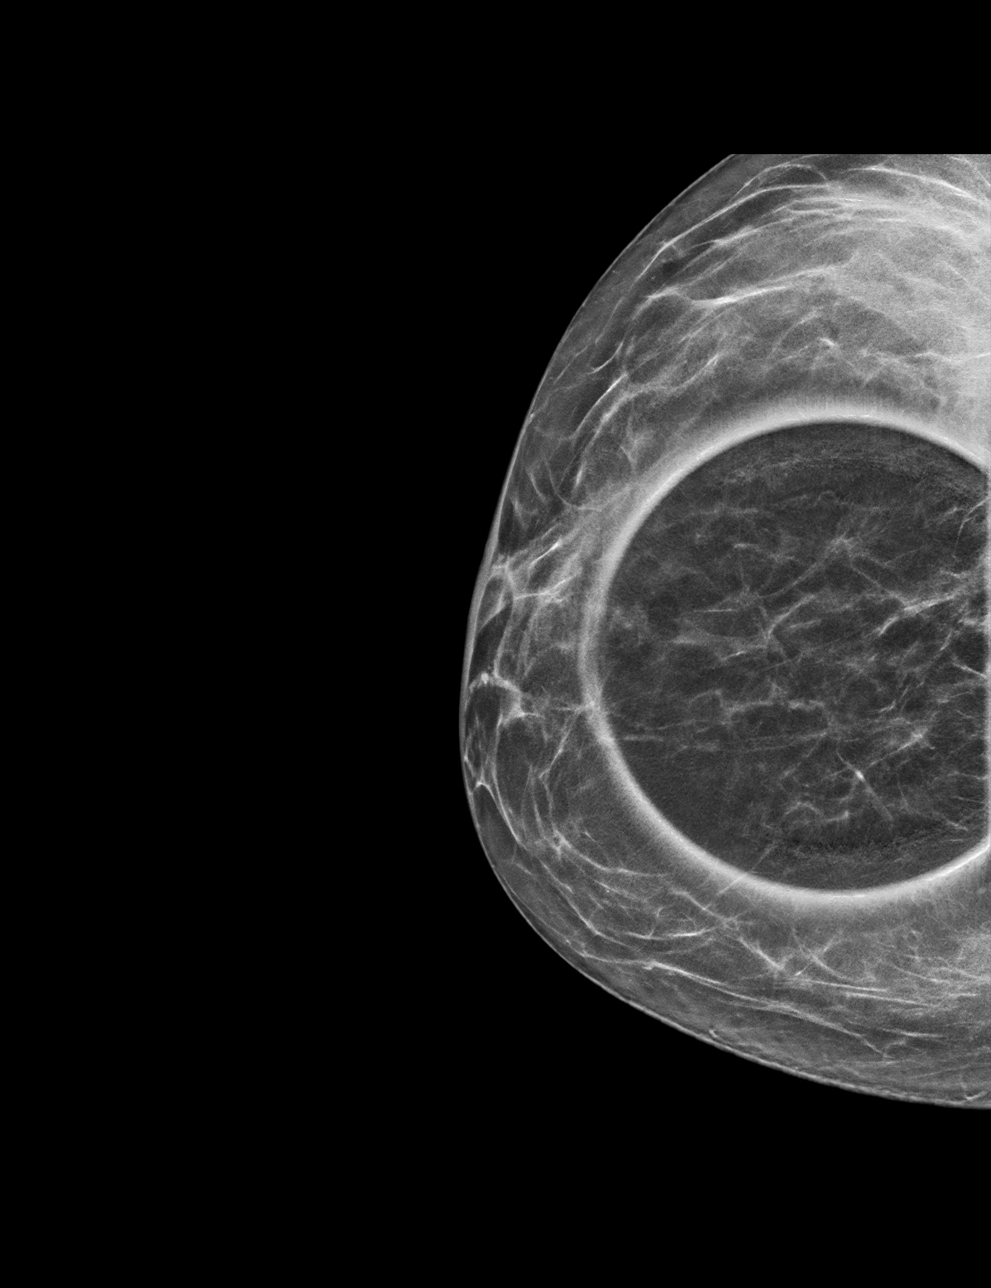

[R CC tomo · tomo slice 27/52.0]
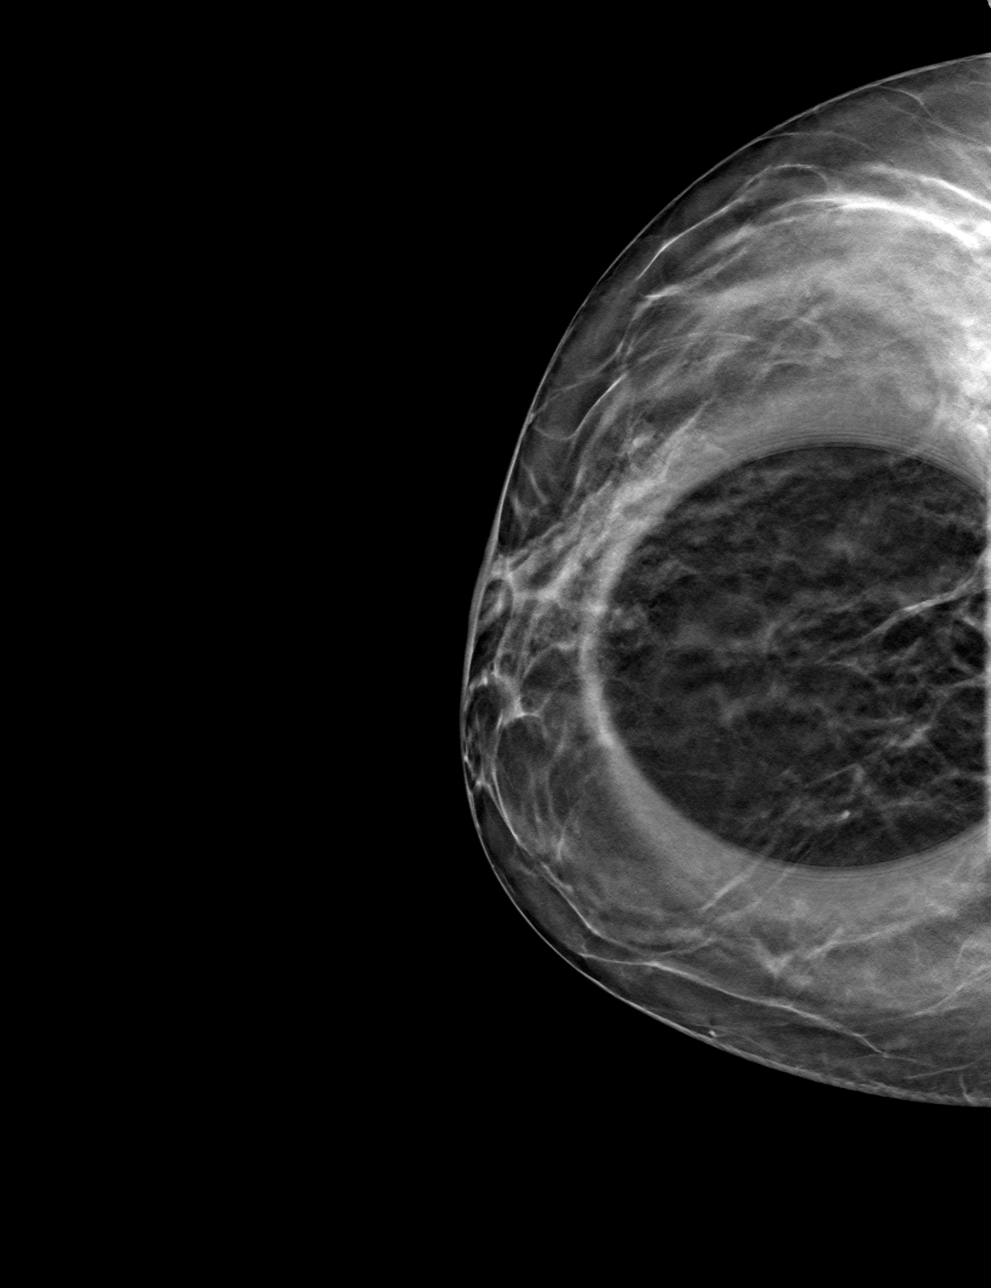

[R ML tomo · tomo slice 36/71.0]
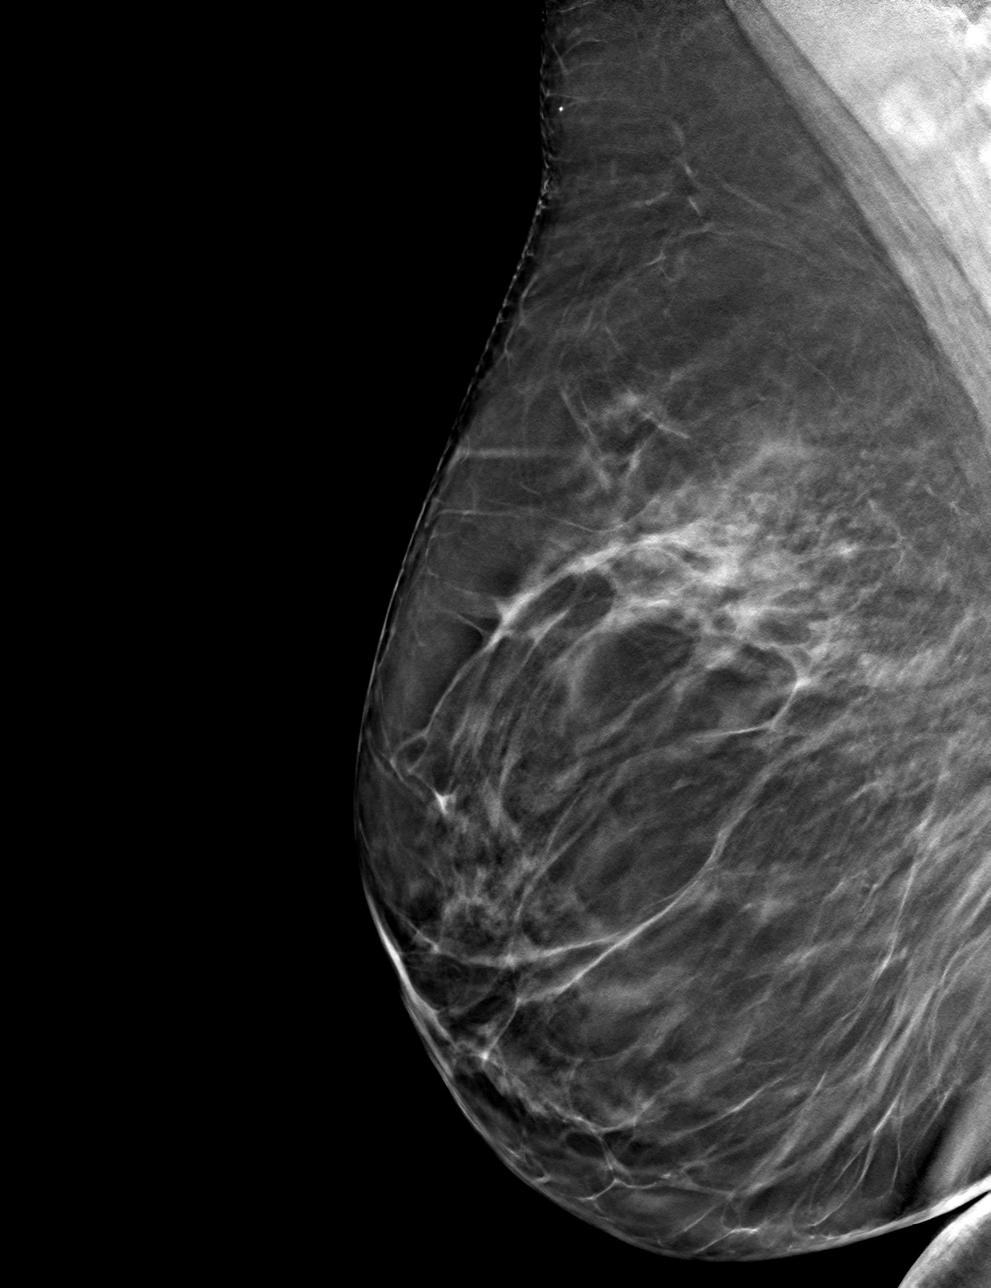

[4 of 12 positions shown; findings below may reference images not displayed]

ACR Breast Density Category b: There are scattered areas of
fibroglandular density.
FINDINGS: Additional 2-D and 3-D images are performed. These views show no
persistent asymmetry in the central portion of the RIGHT breast. No
suspicious mass, distortion, or microcalcifications are identified
to suggest presence of malignancy.
IMPRESSION: No mammographic evidence for malignancy.

RECOMMENDATION:
Screening mammogram in one year.(Code:[id number])

I have discussed the findings and recommendations with the patient.
If applicable, a reminder letter will be sent to the patient
regarding the next appointment.

BI-RADS CATEGORY  1: Negative.
